# Patient Record
Sex: Female | Born: 2000
Health system: Southern US, Community
[De-identification: ages and names within clinical notes are randomized; demographics above are authoritative.]

## PROBLEM LIST (undated history)

## (undated) ENCOUNTER — Emergency Department (HOSPITAL_COMMUNITY): Disposition: A | Discharge status: Home / Self Care | Payer: Self-pay

## (undated) DIAGNOSIS — J45909 Unspecified asthma, uncomplicated: Secondary | ICD-10-CM

## (undated) HISTORY — PX: WISDOM TOOTH EXTRACTION: SHX21

---
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
## 2020-02-11 DIAGNOSIS — Z1321 Encounter for screening for nutritional disorder: Secondary | ICD-10-CM | POA: Diagnosis not present

## 2020-02-11 DIAGNOSIS — R41840 Attention and concentration deficit: Secondary | ICD-10-CM | POA: Diagnosis not present

## 2020-02-11 DIAGNOSIS — F411 Generalized anxiety disorder: Secondary | ICD-10-CM | POA: Diagnosis not present

## 2020-02-27 DIAGNOSIS — N760 Acute vaginitis: Secondary | ICD-10-CM | POA: Diagnosis not present

## 2020-02-27 DIAGNOSIS — Z113 Encounter for screening for infections with a predominantly sexual mode of transmission: Secondary | ICD-10-CM | POA: Diagnosis not present

## 2020-04-08 ENCOUNTER — Other Ambulatory Visit: Payer: Self-pay | Admitting: Dental General Practice

## 2020-05-09 DIAGNOSIS — U071 COVID-19: Secondary | ICD-10-CM | POA: Diagnosis not present

## 2020-05-09 DIAGNOSIS — N3000 Acute cystitis without hematuria: Secondary | ICD-10-CM | POA: Diagnosis not present

## 2020-05-09 DIAGNOSIS — R109 Unspecified abdominal pain: Secondary | ICD-10-CM | POA: Diagnosis not present

## 2020-05-09 DIAGNOSIS — R1013 Epigastric pain: Secondary | ICD-10-CM | POA: Diagnosis not present

## 2020-07-14 DIAGNOSIS — J069 Acute upper respiratory infection, unspecified: Secondary | ICD-10-CM | POA: Diagnosis not present

## 2020-08-04 ENCOUNTER — Other Ambulatory Visit: Payer: Self-pay | Admitting: Pediatrics

## 2020-08-05 ENCOUNTER — Other Ambulatory Visit: Payer: Self-pay

## 2020-08-05 MED ORDER — DASETTA 7/7/7 0.5/0.75/1-35 MG-MCG PO TABS
ORAL_TABLET | ORAL | 0 refills | Status: DC
  Filled 2020-08-05: qty 28, 28d supply, fill #0

## 2020-08-06 ENCOUNTER — Other Ambulatory Visit: Payer: Self-pay

## 2020-08-07 ENCOUNTER — Other Ambulatory Visit: Payer: Self-pay

## 2020-08-25 ENCOUNTER — Other Ambulatory Visit: Payer: Self-pay

## 2020-08-25 DIAGNOSIS — Z6831 Body mass index (BMI) 31.0-31.9, adult: Secondary | ICD-10-CM | POA: Diagnosis not present

## 2020-08-25 DIAGNOSIS — J4520 Mild intermittent asthma, uncomplicated: Secondary | ICD-10-CM | POA: Diagnosis not present

## 2020-08-25 DIAGNOSIS — Z0001 Encounter for general adult medical examination with abnormal findings: Secondary | ICD-10-CM | POA: Diagnosis not present

## 2020-08-25 DIAGNOSIS — R052 Subacute cough: Secondary | ICD-10-CM | POA: Diagnosis not present

## 2020-08-25 DIAGNOSIS — Z713 Dietary counseling and surveillance: Secondary | ICD-10-CM | POA: Diagnosis not present

## 2020-08-25 DIAGNOSIS — Z113 Encounter for screening for infections with a predominantly sexual mode of transmission: Secondary | ICD-10-CM | POA: Diagnosis not present

## 2020-08-25 MED ORDER — DASETTA 7/7/7 0.5/0.75/1-35 MG-MCG PO TABS
ORAL_TABLET | ORAL | 11 refills | Status: DC
  Filled 2020-08-25 – 2020-08-28 (×2): qty 84, 84d supply, fill #0
  Filled 2020-11-24: qty 84, 84d supply, fill #1
  Filled 2021-02-10: qty 84, 84d supply, fill #2
  Filled 2021-05-05: qty 84, 84d supply, fill #3

## 2020-08-28 ENCOUNTER — Other Ambulatory Visit: Payer: Self-pay

## 2020-09-08 DIAGNOSIS — R059 Cough, unspecified: Secondary | ICD-10-CM | POA: Diagnosis not present

## 2020-11-24 ENCOUNTER — Other Ambulatory Visit: Payer: Self-pay

## 2020-11-24 DIAGNOSIS — R053 Chronic cough: Secondary | ICD-10-CM | POA: Diagnosis not present

## 2020-11-24 DIAGNOSIS — J4521 Mild intermittent asthma with (acute) exacerbation: Secondary | ICD-10-CM | POA: Diagnosis not present

## 2021-01-05 DIAGNOSIS — R059 Cough, unspecified: Secondary | ICD-10-CM | POA: Diagnosis not present

## 2021-01-05 DIAGNOSIS — J329 Chronic sinusitis, unspecified: Secondary | ICD-10-CM | POA: Diagnosis not present

## 2021-01-05 DIAGNOSIS — J309 Allergic rhinitis, unspecified: Secondary | ICD-10-CM | POA: Diagnosis not present

## 2021-01-26 DIAGNOSIS — J329 Chronic sinusitis, unspecified: Secondary | ICD-10-CM | POA: Diagnosis not present

## 2021-01-26 DIAGNOSIS — J309 Allergic rhinitis, unspecified: Secondary | ICD-10-CM | POA: Diagnosis not present

## 2021-01-27 DIAGNOSIS — J301 Allergic rhinitis due to pollen: Secondary | ICD-10-CM | POA: Diagnosis not present

## 2021-02-07 DIAGNOSIS — J069 Acute upper respiratory infection, unspecified: Secondary | ICD-10-CM | POA: Diagnosis not present

## 2021-02-10 ENCOUNTER — Other Ambulatory Visit: Payer: Self-pay

## 2021-02-16 ENCOUNTER — Other Ambulatory Visit: Payer: Self-pay

## 2021-02-16 DIAGNOSIS — S8991XA Unspecified injury of right lower leg, initial encounter: Secondary | ICD-10-CM | POA: Diagnosis not present

## 2021-02-16 DIAGNOSIS — S93401A Sprain of unspecified ligament of right ankle, initial encounter: Secondary | ICD-10-CM | POA: Diagnosis not present

## 2021-02-16 DIAGNOSIS — M25571 Pain in right ankle and joints of right foot: Secondary | ICD-10-CM | POA: Diagnosis not present

## 2021-02-16 MED ORDER — EPINEPHRINE 0.3 MG/0.3ML IJ SOAJ
INTRAMUSCULAR | 2 refills | Status: AC
  Filled 2021-02-16: qty 2, 2d supply, fill #0
  Filled 2021-02-26: qty 2, 2d supply, fill #1

## 2021-02-26 ENCOUNTER — Other Ambulatory Visit: Payer: Self-pay

## 2021-03-02 DIAGNOSIS — E669 Obesity, unspecified: Secondary | ICD-10-CM | POA: Diagnosis not present

## 2021-03-02 DIAGNOSIS — J309 Allergic rhinitis, unspecified: Secondary | ICD-10-CM | POA: Diagnosis not present

## 2021-03-02 DIAGNOSIS — F419 Anxiety disorder, unspecified: Secondary | ICD-10-CM | POA: Diagnosis not present

## 2021-03-02 DIAGNOSIS — Z23 Encounter for immunization: Secondary | ICD-10-CM | POA: Diagnosis not present

## 2021-03-02 DIAGNOSIS — Z6833 Body mass index (BMI) 33.0-33.9, adult: Secondary | ICD-10-CM | POA: Diagnosis not present

## 2021-05-05 ENCOUNTER — Other Ambulatory Visit: Payer: Self-pay

## 2021-06-01 DIAGNOSIS — Z6837 Body mass index (BMI) 37.0-37.9, adult: Secondary | ICD-10-CM | POA: Diagnosis not present

## 2021-06-01 DIAGNOSIS — Z01419 Encounter for gynecological examination (general) (routine) without abnormal findings: Secondary | ICD-10-CM | POA: Diagnosis not present

## 2021-06-01 DIAGNOSIS — Z0001 Encounter for general adult medical examination with abnormal findings: Secondary | ICD-10-CM | POA: Diagnosis not present

## 2021-06-01 DIAGNOSIS — E669 Obesity, unspecified: Secondary | ICD-10-CM | POA: Diagnosis not present

## 2021-06-01 DIAGNOSIS — Z3041 Encounter for surveillance of contraceptive pills: Secondary | ICD-10-CM | POA: Diagnosis not present

## 2021-06-01 DIAGNOSIS — Z124 Encounter for screening for malignant neoplasm of cervix: Secondary | ICD-10-CM | POA: Diagnosis not present

## 2021-06-01 DIAGNOSIS — F419 Anxiety disorder, unspecified: Secondary | ICD-10-CM | POA: Diagnosis not present

## 2021-06-15 DIAGNOSIS — Z1331 Encounter for screening for depression: Secondary | ICD-10-CM | POA: Diagnosis not present

## 2021-06-15 DIAGNOSIS — Z0001 Encounter for general adult medical examination with abnormal findings: Secondary | ICD-10-CM | POA: Diagnosis not present

## 2021-07-25 ENCOUNTER — Other Ambulatory Visit: Payer: Self-pay

## 2021-07-27 ENCOUNTER — Other Ambulatory Visit: Payer: Self-pay

## 2021-07-27 MED ORDER — DASETTA 7/7/7 0.5/0.75/1-35 MG-MCG PO TABS
ORAL_TABLET | ORAL | 1 refills | Status: DC
  Filled 2021-07-27: qty 28, 28d supply, fill #0
  Filled 2021-08-24 – 2022-07-21 (×3): qty 28, 28d supply, fill #1

## 2021-08-25 ENCOUNTER — Other Ambulatory Visit: Payer: Self-pay

## 2021-08-27 ENCOUNTER — Other Ambulatory Visit: Payer: Self-pay

## 2021-08-27 MED ORDER — NORETHIN-ETH ESTRAD TRIPHASIC 0.5/0.75/1-35 MG-MCG PO TABS
ORAL_TABLET | ORAL | 2 refills | Status: DC
  Filled 2021-08-27: qty 84, 84d supply, fill #0
  Filled 2021-11-17: qty 84, 84d supply, fill #1
  Filled 2022-02-08: qty 84, 84d supply, fill #2

## 2021-09-02 ENCOUNTER — Other Ambulatory Visit: Payer: Self-pay

## 2021-09-02 DIAGNOSIS — Z6833 Body mass index (BMI) 33.0-33.9, adult: Secondary | ICD-10-CM | POA: Diagnosis not present

## 2021-09-02 DIAGNOSIS — J019 Acute sinusitis, unspecified: Secondary | ICD-10-CM | POA: Diagnosis not present

## 2021-09-02 DIAGNOSIS — E669 Obesity, unspecified: Secondary | ICD-10-CM | POA: Diagnosis not present

## 2021-09-03 ENCOUNTER — Other Ambulatory Visit: Payer: Self-pay

## 2021-09-03 MED ORDER — DOXYCYCLINE HYCLATE 100 MG PO TABS
100.0000 mg | ORAL_TABLET | Freq: Two times a day (BID) | ORAL | 0 refills | Status: AC
  Filled 2021-09-03: qty 14, 7d supply, fill #0

## 2021-11-17 ENCOUNTER — Other Ambulatory Visit: Payer: Self-pay

## 2022-02-08 ENCOUNTER — Other Ambulatory Visit: Payer: Self-pay

## 2022-02-09 ENCOUNTER — Other Ambulatory Visit: Payer: Self-pay

## 2022-02-15 DIAGNOSIS — Z6832 Body mass index (BMI) 32.0-32.9, adult: Secondary | ICD-10-CM | POA: Diagnosis not present

## 2022-02-15 DIAGNOSIS — E669 Obesity, unspecified: Secondary | ICD-10-CM | POA: Diagnosis not present

## 2022-02-15 DIAGNOSIS — R21 Rash and other nonspecific skin eruption: Secondary | ICD-10-CM | POA: Diagnosis not present

## 2022-05-04 ENCOUNTER — Other Ambulatory Visit: Payer: Self-pay

## 2022-05-04 MED ORDER — NORETHIN-ETH ESTRAD TRIPHASIC 0.5/0.75/1-35 MG-MCG PO TABS
1.0000 | ORAL_TABLET | Freq: Every day | ORAL | 0 refills | Status: AC
  Filled 2022-05-04: qty 84, 84d supply, fill #0

## 2022-07-21 ENCOUNTER — Other Ambulatory Visit: Payer: Self-pay

## 2022-08-19 ENCOUNTER — Other Ambulatory Visit: Payer: Self-pay

## 2022-08-19 MED ORDER — DASETTA 7/7/7 0.5/0.75/1-35 MG-MCG PO TABS
1.0000 | ORAL_TABLET | Freq: Every day | ORAL | 3 refills | Status: AC
  Filled 2022-08-19: qty 84, 84d supply, fill #0
  Filled 2022-11-09: qty 84, 84d supply, fill #1
  Filled 2023-02-01: qty 84, 84d supply, fill #2
  Filled 2023-04-28: qty 84, 84d supply, fill #3

## 2022-09-03 DIAGNOSIS — Z0001 Encounter for general adult medical examination with abnormal findings: Secondary | ICD-10-CM | POA: Diagnosis not present

## 2022-09-27 DIAGNOSIS — M4300 Spondylolysis, site unspecified: Secondary | ICD-10-CM | POA: Diagnosis not present

## 2022-10-07 DIAGNOSIS — M4307 Spondylolysis, lumbosacral region: Secondary | ICD-10-CM | POA: Diagnosis not present

## 2022-10-15 DIAGNOSIS — M4307 Spondylolysis, lumbosacral region: Secondary | ICD-10-CM | POA: Diagnosis not present

## 2022-11-09 ENCOUNTER — Other Ambulatory Visit: Payer: Self-pay

## 2023-04-28 ENCOUNTER — Other Ambulatory Visit: Payer: Self-pay
